# Patient Record
Sex: Male | Born: 1990 | Race: Black or African American | Hispanic: No | Marital: Single | Smoking: Never smoker
Health system: Southern US, Community
[De-identification: ages and names within clinical notes are randomized; demographics above are authoritative.]

---
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
## 2011-07-24 LAB — URINALYSIS, ROUTINE W REFLEX MICROSCOPIC
Bilirubin Urine: NEGATIVE
Leukocytes, UA: NEGATIVE
Nitrite: NEGATIVE
Urobilinogen, UA: 1

## 2011-07-24 LAB — URINE MICROSCOPIC-ADD ON

## 2011-07-24 LAB — RAPID STREP SCREEN (MED CTR MEBANE ONLY): Streptococcus, Group A Screen (Direct): POSITIVE — AB

## 2016-07-03 ENCOUNTER — Ambulatory Visit (INDEPENDENT_AMBULATORY_CARE_PROVIDER_SITE_OTHER): Payer: BLUE CROSS/BLUE SHIELD | Admitting: Urgent Care

## 2016-07-03 ENCOUNTER — Encounter: Payer: Self-pay | Admitting: Urgent Care

## 2016-07-03 VITALS — BP 122/72 | HR 67 | Temp 99.1°F | Resp 17 | Ht 68.5 in | Wt 182.0 lb

## 2016-07-03 DIAGNOSIS — S0993XA Unspecified injury of face, initial encounter: Secondary | ICD-10-CM | POA: Diagnosis not present

## 2016-07-03 DIAGNOSIS — K0889 Other specified disorders of teeth and supporting structures: Secondary | ICD-10-CM

## 2016-07-03 MED ORDER — TRAMADOL HCL 50 MG PO TABS: 50.0000 mg | ORAL_TABLET | Freq: Three times a day (TID) | ORAL | 0 refills | Status: AC | PRN

## 2016-07-03 MED ORDER — AMOXICILLIN 500 MG PO CAPS: 500.0000 mg | ORAL_CAPSULE | Freq: Three times a day (TID) | ORAL | 0 refills | Status: AC

## 2016-07-03 NOTE — Patient Instructions (Addendum)
Dental Pain Dental pain may be caused by many things, including:  Tooth decay (cavities or caries). Cavities expose the nerve of your tooth to air and hot or cold temperatures. This can cause pain or discomfort.  Abscess or infection. A dental abscess is a collection of infected pus from a bacterial infection in the inner part of the tooth (pulp). It usually occurs at the end of the tooth's root.  Injury.  An unknown reason (idiopathic). Your pain may be mild or severe. It may only occur when:  You are chewing.  You are exposed to hot or cold temperature.  You are eating or drinking sugary foods or beverages, such as soda or candy. Your pain may also be constant. HOME CARE INSTRUCTIONS Watch your dental pain for any changes. The following actions may help to lessen any discomfort that you are feeling:  Take medicines only as directed by your dentist.  If you were prescribed an antibiotic medicine, finish all of it even if you start to feel better.  Keep all follow-up visits as directed by your dentist. This is important.  Do not apply heat to the outside of your face.  Rinse your mouth or gargle with salt water if directed by your dentist. This helps with pain and swelling.  You can make salt water by adding  tsp of salt to 1 cup of warm water.  Apply ice to the painful area of your face:  Put ice in a plastic bag.  Place a towel between your skin and the bag.  Leave the ice on for 20 minutes, 2-3 times per day.  Avoid foods or drinks that cause you pain, such as:  Very hot or very cold foods or drinks.  Sweet or sugary foods or drinks. SEEK MEDICAL CARE IF:  Your pain is not controlled with medicines.  Your symptoms are worse.  You have new symptoms. SEEK IMMEDIATE MEDICAL CARE IF:  You are unable to open your mouth.  You are having trouble breathing or swallowing.  You have a fever.  Your face, neck, or jaw is swollen.   This information is not  intended to replace advice given to you by your health care provider. Make sure you discuss any questions you have with your health care provider.   Document Released: 10/19/2005 Document Revised: 03/05/2015 Document Reviewed: 10/15/2014 Elsevier Interactive Patient Education 2016 [employer]     IF you received an x-ray today, you will receive an invoice from [facility]. Please contact [facility] at [phone] with questions or concerns regarding your invoice.   IF you received labwork today, you will receive an invoice from [facility]. Please contact [facility] at [phone] with questions or concerns regarding your invoice.   Our billing staff will not be able to assist you with questions regarding bills from these companies.  You will be contacted with the lab results as soon as they are available. The fastest way to get your results is to activate your My Chart account. Instructions are located on the last page of this paperwork. If you have not heard from us regarding the results in 2 weeks, please contact this office.     

## 2016-07-03 NOTE — Progress Notes (Signed)
    MRN: [id number] DOB: [birth year]  Subjective:   Hector Stanley is a 25 y.o. male presenting for chief complaint of Dental Pain  Reports 6 day history of worsening dental pain after patient bit a rock by accident. Has some swelling, severe pain over right lower jaw, difficulty chewing, is trying to chew with left side of mouth. States that he was eating a meal and unfortunately bit the rock by accident. Patient contacted his dentist, has an appointment 07/07/2016. Denies fever, bleeding, drainage of pus.   Hector Stanley has a current medication list which includes the following prescription(s): ibuprofen. Also has no allergies on file.  Hector Stanley  has no past medical history on file. Also  has no past surgical history on file.  Objective:   Vitals: BP 122/72 (BP Location: Right Arm, Patient Position: Sitting, Cuff Size: Normal)   Pulse 67   Temp 99.1 F (37.3 C) (Oral)   Resp 17   Ht 5' 8.5" (1.74 m)   Wt 182 lb (82.6 kg)   SpO2 100%   BMI 27.27 kg/m   Physical Exam  Constitutional: He is oriented to person, place, and time. He appears well-developed and well-nourished.  HENT:  Mouth/Throat:    Cardiovascular: Normal rate.   Pulmonary/Chest: Effort normal.  Neurological: He is alert and oriented to person, place, and time.   Assessment and Plan :   1. Pain, dental 2. Tooth injury, initial encounter - Start amoxicillin to cover for infectious process. Keep appointment with dentist. Schedule ibuprofen and APAP. Use Tramadol for break through pain.   Wallis BambergMario Alizabeth Antonio, PA-C [facility] [phone] 07/03/2016 12:45 PM
